# Patient Record
Sex: Female | Born: 1970 | Race: Black or African American | Hispanic: No | Smoking: Never smoker
Health system: Southern US, Community
[De-identification: ages and names within clinical notes are randomized; demographics above are authoritative.]

## PROBLEM LIST (undated history)

## (undated) HISTORY — PX: PARTIAL HYMENECTOMY: SHX327

---

## 2005-10-07 ENCOUNTER — Ambulatory Visit (HOSPITAL_COMMUNITY): Admission: RE | Admit: 2005-10-07 | Discharge: 2005-10-07 | Payer: Self-pay | Admitting: Family Medicine

## 2005-10-07 ENCOUNTER — Encounter: Payer: Self-pay | Admitting: Cardiovascular Disease

## 2005-10-07 ENCOUNTER — Ambulatory Visit: Payer: Self-pay | Admitting: Cardiovascular Disease

## 2005-12-01 ENCOUNTER — Encounter: Admission: RE | Admit: 2005-12-01 | Discharge: 2005-12-01 | Payer: Self-pay | Admitting: Family Medicine

## 2006-07-07 ENCOUNTER — Inpatient Hospital Stay (HOSPITAL_COMMUNITY): Admission: RE | Admit: 2006-07-07 | Discharge: 2006-07-09 | Payer: Self-pay | Admitting: Obstetrics and Gynecology

## 2006-07-07 ENCOUNTER — Encounter (INDEPENDENT_AMBULATORY_CARE_PROVIDER_SITE_OTHER): Payer: Self-pay | Admitting: *Deleted

## 2006-11-26 ENCOUNTER — Encounter: Admission: RE | Admit: 2006-11-26 | Discharge: 2006-11-26 | Payer: Self-pay | Admitting: Family Medicine

## 2007-05-17 ENCOUNTER — Encounter: Admission: RE | Admit: 2007-05-17 | Discharge: 2007-05-17 | Payer: Self-pay | Admitting: Family Medicine

## 2007-09-19 ENCOUNTER — Encounter: Admission: RE | Admit: 2007-09-19 | Discharge: 2007-09-19 | Payer: Self-pay | Admitting: Family Medicine

## 2008-07-25 ENCOUNTER — Encounter: Admission: RE | Admit: 2008-07-25 | Discharge: 2008-07-25 | Payer: Self-pay | Admitting: Obstetrics & Gynecology

## 2008-09-18 ENCOUNTER — Encounter: Admission: RE | Admit: 2008-09-18 | Discharge: 2008-09-18 | Payer: Self-pay | Admitting: Chiropractic Medicine

## 2010-06-25 ENCOUNTER — Encounter: Admission: RE | Admit: 2010-06-25 | Discharge: 2010-06-25 | Payer: Self-pay | Admitting: Obstetrics & Gynecology

## 2010-12-25 ENCOUNTER — Encounter: Payer: Managed Care, Other (non HMO) | Attending: Family Medicine | Admitting: *Deleted

## 2010-12-25 DIAGNOSIS — R7309 Other abnormal glucose: Secondary | ICD-10-CM | POA: Insufficient documentation

## 2010-12-25 DIAGNOSIS — Z713 Dietary counseling and surveillance: Secondary | ICD-10-CM | POA: Insufficient documentation

## 2011-02-06 NOTE — Op Note (Signed)
NAME:  Sophia Flores, Sophia Flores               ACCOUNT NO.:  [id number]   MEDICAL RECORD NO.:  [id number]          PATIENT TYPE:  AMB   LOCATION:  SDC                           FACILITY:  [facility]   PHYSICIAN:  Courtney Paris, M.D.DATE OF BIRTH:  09-23-1970   DATE OF PROCEDURE:  07/07/2006  DATE OF DISCHARGE:                                 OPERATIVE REPORT   PREOPERATIVE DIAGNOSIS:  Bladder injury.   POSTOPERATIVE DIAGNOSIS:  Bladder injury.   OPERATION:  Cystoscopy and posterior bladder repair.   ANESTHESIA:  General.   SURGEON:  Courtney Paris, M.D.   ASSISTANT:  Richardean Sale, M.D.   This 40 year old lady was undergoing a hysterectomy for menometrorrhagia.  She had had two prior C-sections.  In doing the vaginal hysterectomy, a hole  was inadvertently made into the bladder where it was quite attached to the  anterior vaginal tissues.  Urological consultation was then obtained.  By  the time I got to the operating room, the uterus had been removed, the cuff  had been closed, and there was some fluid leaking from the anterior vaginal  wall, consistent with a bladder injury.  The Foley catheter was then  removed, and a cystoscope was inserted, a #21-French, and the bladder lesion  injury could be seen on the posterior wall.  It was separate from the  ureteral orifices, although it was a little closer to the left than to the  right.  She had been previously given some indigo carmine, and I could see  blue efflux from each ureteral orifice.  Then, the hole over the bladder was  then closed with several stitches with 3-0 chromic catgut, but there was  still a little bit of drainage that apparently was from the bladder.  When I  thought it was dry, I put in a Foley catheter, and then with the irrigation  could see another small hole.  I then used some 2-0 chromic to make a second  layer over this with bladder tissue, taking care not to go laterally to  injure the ureteral orifices.   When this was done, it seemed to dry up the  lesion.  I re-passed the cystoscope, and with a 5-French ureteral catheter,  passed this up the left orifice without difficulty or making any  obstruction.  The scope was then removed.  Foley catheter was then  reinserted.  I then helped Dr. Annabell Howells close the anterior vaginal mucosa over  the bladder injury until it was completely closed.  It seemed to be dry at  this point.  The Foley catheter was left indwelling.  I would like to see  the patient in about 10-14 days in the office, and we will do a cystogram  before I pull the Foley catheter.  The rest of the operative note will be  dictated by Dr. Annabell Howells separately.      Courtney Paris, M.D.  Electronically Signed     HMK/MEDQ  D:  07/07/2006  T:  07/08/2006  Job:  [id number]   cc:   Richardean Sale, M.D.  Fax: [phone]

## 2011-02-06 NOTE — Discharge Summary (Signed)
NAME:  Sophia Flores, Sophia Flores               ACCOUNT NO.:  [id number]   MEDICAL RECORD NO.:  [id number]          PATIENT TYPE:  INP   LOCATION:  9302                          FACILITY:  [facility]   PHYSICIAN:  Richardean Sale, M.D.   DATE OF BIRTH:  06-04-1971   DATE OF ADMISSION:  07/07/2006  DATE OF DISCHARGE:  07/09/2006                                 DISCHARGE SUMMARY   ADMISSION DIAGNOSES:  1. Symptomatic uterine fibroids.  2. Menorrhagia.  3. Anemia.  4. Dysmenorrhea.   POSTOPERATIVE DIAGNOSES:  1. Symptomatic uterine fibroids.  2. Menorrhagia.  3. Anemia.  4. Dysmenorrhea.  5. Endometriosis.  6. Paratubal cystotomy with repair.  7. Laparoscopic assisted vaginal hysterectomy with inadvertant cystoscopy      and repair performed on 07/07/2006.   OPERATIVE FINDINGS:  1. Slightly enlarged uterus with multiple subserosal fibroids.  2. Normal appearing adnexa.  3. A 2 cm right paratubal cyst.  4. Endometriosis involving the right ovary.   CONSULTATIONS:  Courtney Paris, M.D. with Urology.   HOSPITAL COURSE AND HISTORY OF PRESENT ILLNESS:  Please see admission  history and physical for details.  Briefly, this is a 40 year old gravida 2,  para 2 African-American female.  She has had progressively worsening menses  with menorrhagia resulting in anemia requiring iron supplementation and  dysmenorrhea.  The patient has been counseled on her options to proceed.  She is status post tubal ligation and has completed childbearing.  After  weighing her options she elected to proceed with a hysterectomy.  On  07/07/2006 she underwent a laparoscopically assisted vaginal hysterectomy at  which time an inadvertent cystotomy occurred.  Dr. Aldean Ast was consulted  intraoperatively performed a cystoscopy with repair of the cystotomy and the  patient tolerated the procedure well.  Postoperatively the patient' course  was unremarkable.   On postoperative day #2 she was ambulating well, was  tolerating a regular  diet and was passing flatus.  A Foley catheter was left in place and to  continue draining the bladder for 10-14 days postoperatively.  The patient's  pain was controlled with oral pain medications and she was subsequently  discharged to home on postop day #2 in good condition.   DISPOSITION:  To home.   CONDITION:  Good.   FOLLOWUP:  The patient will followup with myself in 4 weeks for routine  postoperative visit.  In addition she will followup with Dr. Aldean Ast with  Urology in 10-14 days postop for removal of Foley catheter.   INSTRUCTIONS:  She is to call for any heavy vaginal bleeding.  Any pain not  relieved with pain medication, any vomiting, fever greater than 101, or any  other questions.   MEDICATIONS:  1. Percocet 1-2 tabs p.o. q.4-6 h. p.r.n. pain.  2. Motrin 600 mg p.o. q.6 h. p.r.n. pain.  3. Ferrous Sulfate 325 mg p.o. b.i.d.  4. Colace 100 mg p.o. b.i.d. p.r.n. constipation.  5. Neosporin ointment to be applied to the urethral meatus 2-3 times      daily.   LABORATORY STUDIES:  The patient's postoperative hemoglobin was 8.6, white  count 10.5, platelet count 242, hematocrit 25.0. 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  No endometrial hyperplasia or cervical dysplasia identified.      Richardean Sale, M.D.  Electronically Signed     JW/MEDQ  D:  07/10/2006  T:  07/12/2006  Job:  [id number]

## 2011-02-06 NOTE — Op Note (Signed)
NAME:  Sophia Flores, Sophia Flores               ACCOUNT NO.:  [id number]   MEDICAL RECORD NO.:  [id number]          PATIENT TYPE:  INP   LOCATION:  9302                          FACILITY:  [facility]   PHYSICIAN:  Richardean Sale, M.D.   DATE OF BIRTH:  05-May-1971   DATE OF PROCEDURE:  07/07/2006  DATE OF DISCHARGE:                                 OPERATIVE REPORT   PREOPERATIVE DIAGNOSES:  1. Menorrhagia.  2. Dysmenorrhea.   POSTOPERATIVE DIAGNOSES:  1. Menorrhagia.  2. Dysmenorrhea.  3. Endometriosis.  4. Paratubal cyst.  5. Uterine fibroids.  6. Intraoperative cystotomy.   PROCEDURES:  1. Laparoscopic-assisted vaginal hysterectomy with cystotomy.  2. Cystoscopy and repair of cystotomy.  3. Removal of right paratubal cyst.   SURGEON:  Richardean Sale, M.D.   ASSISTANT:  Cordelia Pen A. Rosalio Macadamia, M.D.   UROLOGY CONSULTATION:  Courtney Paris, M.D.   ANESTHESIA:  General.   COMPLICATIONS:  Cystotomy.   ESTIMATED BLOOD LOSS:  1000 mL.   OPERATIVE FINDINGS:  Approximate 10 weeks' size uterus with normal-appearing  fallopian tubes, a 2 cm right paratubal cyst, two subserosal uterine  fibroids.   SPECIMENS:  Uterus and cervix and paratubal cyst sent to pathology.   INDICATIONS:  This is a 40 year old gravida 2, para 2, African American  female with a history of progressively heavier menses and dysmenorrhea.  The  patient requires the use of the naproxen sodium to control her dysmenorrhea  and has been on iron supplementation due to anemia resulting from menstrual  blood loss.  The patient's cycles have been occurring every 23-29 days, very  heavy on the first 2 days with passage of clots.  The patient's evaluation  has included an ultrasound which showed small uterine fibroids, normal-  appearing adnexa.  The patient underwent endometrial biopsy prior to the  procedure, which was benign.  She has been counseled on her options to  proceed.  She is status post tubal ligation and is not in  need of any  contraception.  Options of control with oral contraceptive pills, a  progesterone-only IUD, endometrial ablation, and hysterectomy have been  reviewed with the patient.  After carefully weighing the risks and benefits  of all procedures, she has decided to proceed with definitive management  with a hysterectomy.  Given that the patient has had two prior cesarean  deliveries, we plan to proceed with a laparoscopic approach, possibly  performing a total laparoscopic hysterectomy or laparoscopic-assisted  vaginal hysterectomy.  The patient is only 40 and desires to retain ovaries.  The adnexa appear normal on ultrasound as well.   Prior to the procedure, the risks, benefits and alternatives of the  procedure were reviewed with the patient in detail.  We discussed the risks,  which include but are not limited to hemorrhage requiring transfusion,  infection, approximately 1% chance of injury to the bowel or the bladder,  the ureters or other intra-abdominal organs which could require surgery  either at the time of this procedure or in the future.  We discussed the  risks of DVT and pulmonary embolus as well as general  surgical risks.  We  also discussed the possibility of having to convert to open abdominal  hysterectomy.  The patient voices understanding of all the above and desires  to proceed.  Informed consent has been obtained before proceeding to the OR.   PROCEDURE:  The patient was taken to the operating room, where she was given  a general anesthetic.  She was then prepped and draped in the usual sterile  fashion and placed in the dorsal lithotomy position.  A Foley catheter was  then placed.  Bimanual exam was performed.  The uterus was approximately 8-  10-week size, mobile in the midline, with no obvious adnexal masses.  A  speculum was then placed in the vagina.  The cervix was grasped with a  single-tooth tenaculum and a uterine sound was then used to sound the   uterus.  Sound length was 10 cm.  There was a moderate amount of descent of  the cervix with placement of the tenaculum.  The RUMI uterine manipulator  was then placed in the standard fashion and the speculum was removed.  The  attention was turned to the patient's abdomen after sterile gown and gloves  were changed.  Approximate 5 mL of 0.25% plain Marcaine were injected into  the infraumbilical area and a 10 mm skin incision was made with a scalpel.  This was carried down sharply to the fascia.  The fascia was then grasped  between two Kocher clamps, elevated, and the fascia was then incised with  the Mayo scissors.  The fascial incision was secured with a pursestring  Vicryl suture and the peritoneum was then identified and entered without  diffiuculty.  The Hasson trocar and sleeve were then introduced and intra-  abdominal placement was confirmed with the laparoscopic.  CO2 gas was then  allowed to insufflate.  Two additional ports were then made in the lower  quadrants, first by injecting 0.25% Marcaine approximately 2 mL on each  side, then making a 5 mm skin incision, and the trocars were introduced  under direct visualization in an area that was free of the inferior  epigastric vessels.  Atraumatic grasper and a blunt probe were then  introduced and the pelvis was then examined with the findings noted above.  There was a moderate amount of scarring from the patient's prior cesarean  deliveries.  The uterus was mobile anterior-posteriorly but with only  moderate mobility laterally.  The round ligaments on both sides were then  grasped with the Gyrus, coagulated and transected with good hemostasis.  The  utero-ovarian ligament on left side and the fallopian tube were then grasped  with the Gyrus, cauterized and transected, with good hemostasis.  The broad  ligament on the left side was then dissected down to level of the uterine artery.  Attention was then turned to the right side.   The utero-ovarian  ligament was then grasped with the Gyrus, was cauterized and transected.  The fallopian tube was grasped with the Gyrus, was cauterized and  transected.  The broad ligament was then incised, and an attempt was made to  skeletonize the uterine arteries but on the right side it was difficult to  isolate the uterine vasculature.  At this point attention was then turned to  the bladder flap.  The bladder was dissected off the lower uterine segment  with sharp dissection.  Attention was then turned back to the right side,  where further skeletonization of the uterine vessels was attempted but  was  unsuccessful.  Given that I did not feel I could secure the uterine  vasculature, I made the decision to convert to a laparoscopic-assisted  vaginal hysterectomy instead of a total laparoscopic hysterectomy or  laparoscopic supracervical hysterectomy.  Prior to proceeding below, the  pedicles were carefully inspected and hemostasis was confirmed and the  Nezhat was used to irrigate the pelvis.  At this point attention was then  turned the patient's vagina.  The RUMI manipulator was removed.  A posterior  weighted speculum was then placed in the vagina and the cervix was grasped  with a single-tooth tenaculum.  Approximately 15 mL of dilute Vasopressin  were then injected circumferentially and a circumferential incision was made  around the cervix with the Bovie.  The posterior cul-de-sac was then entered  sharply without difficulty.  There was no evidence of any adhesive disease  in the posterior cul-de-sac.  Attention was then turned to the anterior  cervix, and sharp dissection was then used to dissect the bladder off of the  cervix in the standard fashion.  There was a moderate amount of adhesive  disease encountered and in the process of making the anterior colporrhaphy  incision in the standard fashion, an inadvertent cystotomy was made.  Given  this finding, I asked for  consultation with Urology and Dr. Aldean Ast was  called.  While waiting for Dr. Aldean Ast to come to the OR, I proceeded to  continue with the hysterectomy, first by securing the uterosacral ligaments  on both sides.  These were clamped, transected and suture ligated with  Vicryl suture.  The uterine vessels on both sides were then clamped,  transected and suture ligated as well.  The Gyrus was then also used to  cauterize any additional attachments, and these were transected.  The uterus  was then bisected in the midline in order to facilitate removal.  Once both  halves of the uterus and cervix were removed, this was sent to pathology.  The vaginal cuff and the bladder were then  very carefully inspected.  There  was a moderate of bleeding coming from the edges of the vaginal cuff.  The  posterior cuff was then run with a locked Vicryl suture.  There was small  amount of bleeding coming from beneath the anterior portion of the vaginal cuff.  This was cauterized with the Bovie, taking care to assure that no  additional trauma was made to the bladder.  At this point the posterior  aspect of the vaginal cuff was closed using interrupted Vicryl sutures up to  the level of the uterosacral ligaments.  At this point Dr. Aldean Ast entered  the room and inspected the area.  Cystoscopy was performed by Dr. Aldean Ast.  Indigo carmine was introduced in the patient's IV fluids, and indigo carmine  was noted to be effluxing from both ureters.  The cystoscopy and cystotomy  repair will be dictated by Dr. Aldean Ast.   Once the cystotomy had been adequately repaired by Dr. Aldean Ast, I then  proceeded to close the remaining portion of the vaginal cuff with vicryl  suture.  The vaginal cuff was hemostatic.  The Foley catheter had already  been replaced.  The vagina was then packed with 1-inch plain gauze soaked in  Estrace cream for additional hemostasis.  At this point attention was then  turned back to  the patient's abdomen after sterile gown and gloves were  changed.  CO2 gas was then allowed to reinsufflate and the camera  was then  introduced.  The pelvis was then very carefully inspected and the Nezhat was  then used to irrigate.  Any areas of bleeding along the peritoneal surfaces  were cauterized with the bipolar cautery.  The vaginal cuff was very  carefully inspected, and there appeared to be no active bleeding.  A 2 x 2  piece of Surgicel was introduced into the pelvis through one of the 5 mm  ports, and this was placed over the vaginal cuff for additional hemostasis  and for prevention of adhesion formation.  The utero-ovarian pedicles were  then carefully inspected and were hemostatic.  The right fallopian tube  contained within it an approximately 2 cm paratubal cyst.  This was removed  using the electrocautery and the laparoscopic scissors and was sent to  pathology labeled as right paratubal cyst.  Once all pedicles in the  pelvis were confirmed as hemostatic, attention was then turned to the upper  abdomen, where the liver and gallbladder appeared normal.  At this point the  procedure was terminated.  All instruments were removed from the patient's  abdomen.  The trocars were removed under direct visualization and CO2 gas  was then allowed to escape.  The pursestring fascial suture in the umbilical  incision was then tied with adequate closure of the fascia.  The umbilical  skin incision was closed with 4-0 Vicryl in a subcuticular fashion and the 5  mm skin incision was then closed with Dermabond.   The patient tolerated the procedure very well.  All sponge, lap, needle and  instrument counts were correct x2.  The patient was taken to the recovery  room awake and in stable condition      Richardean Sale, M.D.  Electronically Signed     JW/MEDQ  D:  07/07/2006  T:  07/09/2006  Job:  [id number]

## 2011-02-24 ENCOUNTER — Ambulatory Visit: Payer: Managed Care, Other (non HMO) | Admitting: *Deleted

## 2011-05-04 ENCOUNTER — Ambulatory Visit
Admission: RE | Admit: 2011-05-04 | Discharge: 2011-05-04 | Disposition: A | Discharge status: Home / Self Care | Payer: Managed Care, Other (non HMO) | Source: Ambulatory Visit | Attending: Rheumatology | Admitting: Rheumatology

## 2011-05-04 ENCOUNTER — Other Ambulatory Visit: Payer: Self-pay | Admitting: Rheumatology

## 2011-05-04 DIAGNOSIS — M7918 Myalgia, other site: Secondary | ICD-10-CM

## 2011-05-04 DIAGNOSIS — R1031 Right lower quadrant pain: Secondary | ICD-10-CM

## 2011-07-16 ENCOUNTER — Other Ambulatory Visit: Payer: Self-pay

## 2011-07-16 ENCOUNTER — Other Ambulatory Visit (HOSPITAL_COMMUNITY)
Admission: RE | Admit: 2011-07-16 | Discharge: 2011-07-16 | Disposition: A | Discharge status: Home / Self Care | Payer: Managed Care, Other (non HMO) | Source: Ambulatory Visit | Attending: Family Medicine | Admitting: Family Medicine

## 2011-07-16 DIAGNOSIS — Z01419 Encounter for gynecological examination (general) (routine) without abnormal findings: Secondary | ICD-10-CM | POA: Insufficient documentation

## 2011-08-05 ENCOUNTER — Other Ambulatory Visit (HOSPITAL_COMMUNITY): Payer: Self-pay | Admitting: Family Medicine

## 2011-08-05 DIAGNOSIS — Z1231 Encounter for screening mammogram for malignant neoplasm of breast: Secondary | ICD-10-CM

## 2011-09-04 ENCOUNTER — Ambulatory Visit (HOSPITAL_COMMUNITY): Payer: Managed Care, Other (non HMO)

## 2011-09-08 ENCOUNTER — Ambulatory Visit (HOSPITAL_COMMUNITY)
Admission: RE | Admit: 2011-09-08 | Discharge: 2011-09-08 | Disposition: A | Discharge status: Home / Self Care | Payer: Self-pay | Source: Ambulatory Visit | Attending: Family Medicine | Admitting: Family Medicine

## 2011-09-08 DIAGNOSIS — Z1231 Encounter for screening mammogram for malignant neoplasm of breast: Secondary | ICD-10-CM | POA: Insufficient documentation

## 2012-08-17 ENCOUNTER — Other Ambulatory Visit (HOSPITAL_COMMUNITY): Payer: Self-pay | Admitting: Obstetrics & Gynecology

## 2012-08-17 DIAGNOSIS — Z1231 Encounter for screening mammogram for malignant neoplasm of breast: Secondary | ICD-10-CM

## 2012-09-19 ENCOUNTER — Ambulatory Visit (HOSPITAL_COMMUNITY)
Admission: RE | Admit: 2012-09-19 | Discharge: 2012-09-19 | Disposition: A | Discharge status: Home / Self Care | Payer: BC Managed Care – PPO | Source: Ambulatory Visit | Attending: Obstetrics & Gynecology | Admitting: Obstetrics & Gynecology

## 2012-09-19 DIAGNOSIS — Z1231 Encounter for screening mammogram for malignant neoplasm of breast: Secondary | ICD-10-CM

## 2012-09-27 ENCOUNTER — Other Ambulatory Visit: Payer: Self-pay | Admitting: Obstetrics & Gynecology

## 2012-09-27 DIAGNOSIS — R928 Other abnormal and inconclusive findings on diagnostic imaging of breast: Secondary | ICD-10-CM

## 2012-10-06 ENCOUNTER — Ambulatory Visit
Admission: RE | Admit: 2012-10-06 | Discharge: 2012-10-06 | Disposition: A | Discharge status: Home / Self Care | Payer: BC Managed Care – PPO | Source: Ambulatory Visit | Attending: Obstetrics & Gynecology | Admitting: Obstetrics & Gynecology

## 2012-10-06 DIAGNOSIS — R928 Other abnormal and inconclusive findings on diagnostic imaging of breast: Secondary | ICD-10-CM

## 2013-02-03 ENCOUNTER — Other Ambulatory Visit: Payer: Self-pay | Admitting: Rheumatology

## 2013-02-03 ENCOUNTER — Ambulatory Visit
Admission: RE | Admit: 2013-02-03 | Discharge: 2013-02-03 | Disposition: A | Discharge status: Home / Self Care | Payer: BC Managed Care – PPO | Source: Ambulatory Visit | Attending: Rheumatology | Admitting: Rheumatology

## 2013-02-03 DIAGNOSIS — M545 Low back pain, unspecified: Secondary | ICD-10-CM

## 2013-08-16 ENCOUNTER — Other Ambulatory Visit: Payer: Self-pay

## 2013-08-16 DIAGNOSIS — Z1231 Encounter for screening mammogram for malignant neoplasm of breast: Secondary | ICD-10-CM

## 2013-10-09 ENCOUNTER — Ambulatory Visit
Admission: RE | Admit: 2013-10-09 | Discharge: 2013-10-09 | Disposition: A | Discharge status: Home / Self Care | Payer: BC Managed Care – PPO | Source: Ambulatory Visit

## 2013-10-09 DIAGNOSIS — Z1231 Encounter for screening mammogram for malignant neoplasm of breast: Secondary | ICD-10-CM

## 2016-10-06 ENCOUNTER — Other Ambulatory Visit: Payer: Self-pay | Admitting: Family

## 2016-10-06 DIAGNOSIS — E049 Nontoxic goiter, unspecified: Secondary | ICD-10-CM

## 2016-10-30 ENCOUNTER — Ambulatory Visit
Admission: RE | Admit: 2016-10-30 | Discharge: 2016-10-30 | Disposition: A | Discharge status: Home / Self Care | Payer: BC Managed Care – PPO | Source: Ambulatory Visit | Attending: Family | Admitting: Family

## 2016-10-30 DIAGNOSIS — E049 Nontoxic goiter, unspecified: Secondary | ICD-10-CM

## 2018-10-07 ENCOUNTER — Other Ambulatory Visit (HOSPITAL_COMMUNITY)
Admission: RE | Admit: 2018-10-07 | Discharge: 2018-10-07 | Disposition: A | Discharge status: Home / Self Care | Payer: BC Managed Care – PPO | Source: Ambulatory Visit | Attending: Nurse Practitioner | Admitting: Nurse Practitioner

## 2018-10-07 ENCOUNTER — Other Ambulatory Visit: Payer: Self-pay | Admitting: Nurse Practitioner

## 2018-10-07 DIAGNOSIS — Z1231 Encounter for screening mammogram for malignant neoplasm of breast: Secondary | ICD-10-CM

## 2018-10-07 DIAGNOSIS — Z01411 Encounter for gynecological examination (general) (routine) with abnormal findings: Secondary | ICD-10-CM | POA: Insufficient documentation

## 2018-10-10 ENCOUNTER — Other Ambulatory Visit: Payer: Self-pay | Admitting: Nurse Practitioner

## 2018-10-14 LAB — CYTOLOGY - PAP
Diagnosis: NEGATIVE
HPV: NOT DETECTED

## 2018-11-25 ENCOUNTER — Ambulatory Visit
Admission: RE | Admit: 2018-11-25 | Discharge: 2018-11-25 | Disposition: A | Discharge status: Home / Self Care | Payer: BC Managed Care – PPO | Source: Ambulatory Visit | Attending: Nurse Practitioner | Admitting: Nurse Practitioner

## 2018-11-25 DIAGNOSIS — Z1231 Encounter for screening mammogram for malignant neoplasm of breast: Secondary | ICD-10-CM

## 2019-04-04 IMAGING — MG DIGITAL SCREENING BILATERAL MAMMOGRAM WITH TOMO AND CAD
6 of 10 series · 6 of 30 positions shown · non-contrast
Comparison: Previous exam(s).

CLINICAL DATA: Screening.

EXAM:
DIGITAL SCREENING BILATERAL MAMMOGRAM WITH TOMO AND CAD

[R CC synth-2D]
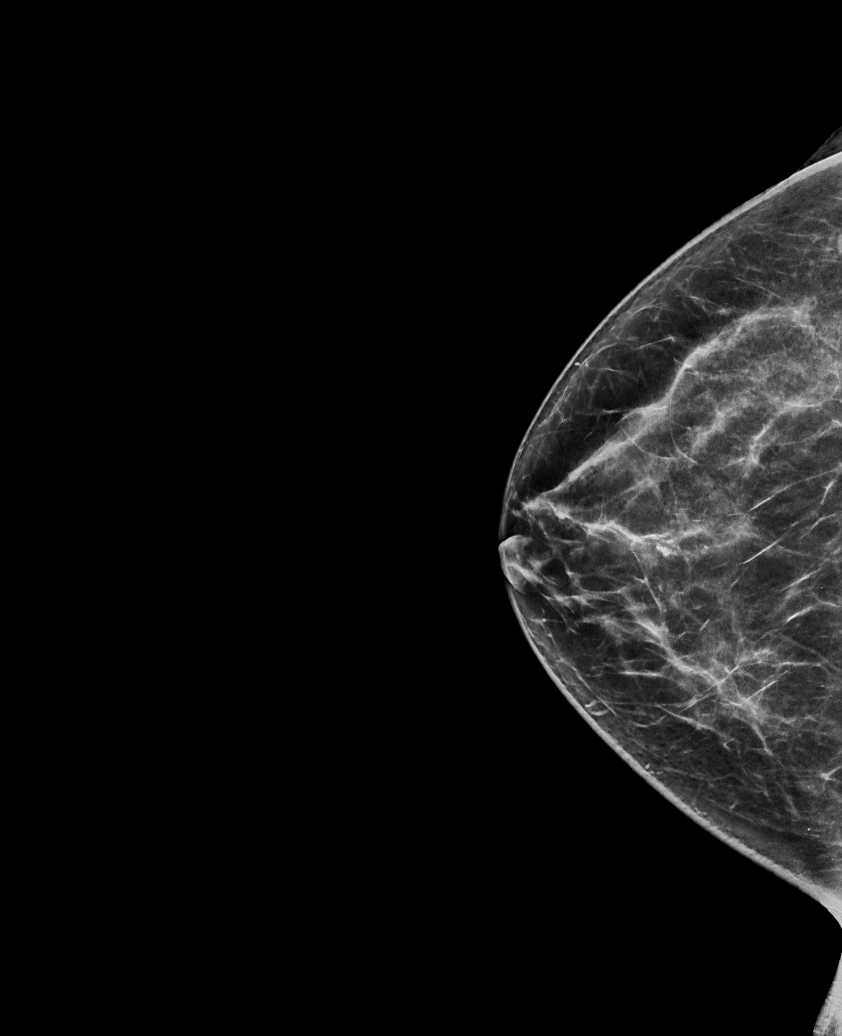

[R MLO synth-2D (1 of 2)]
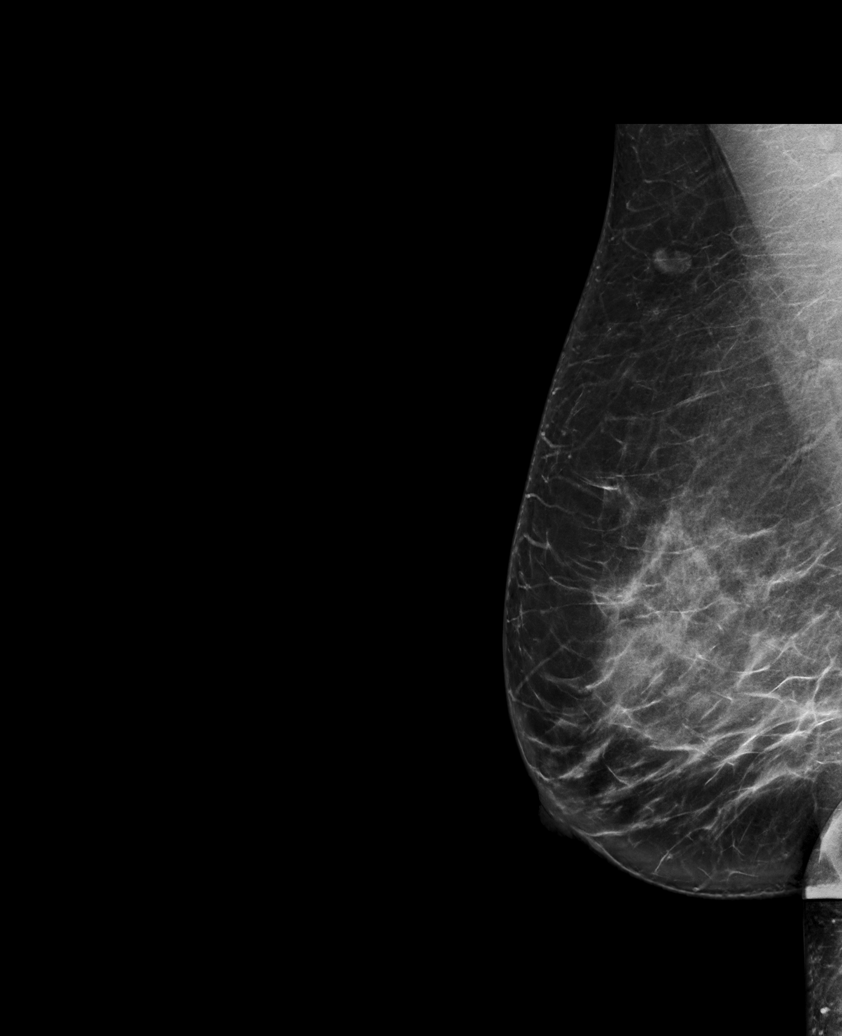

[R MLO synth-2D (2 of 2)]
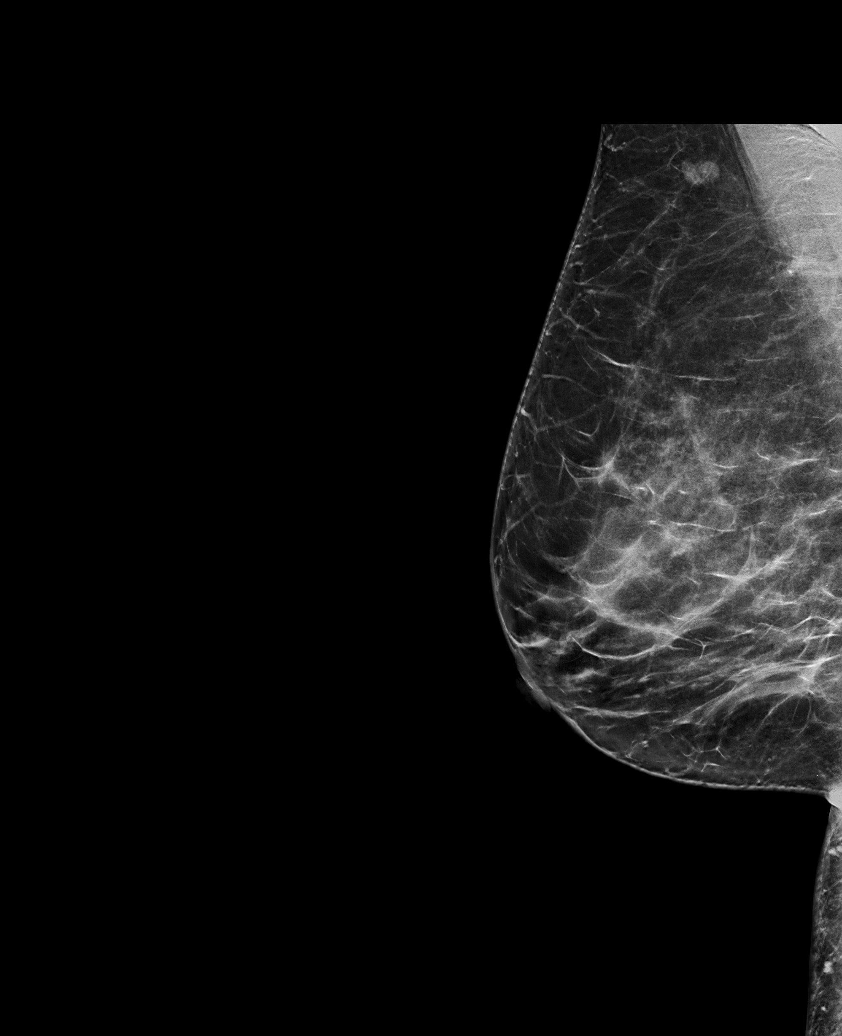

[L CC synth-2D]
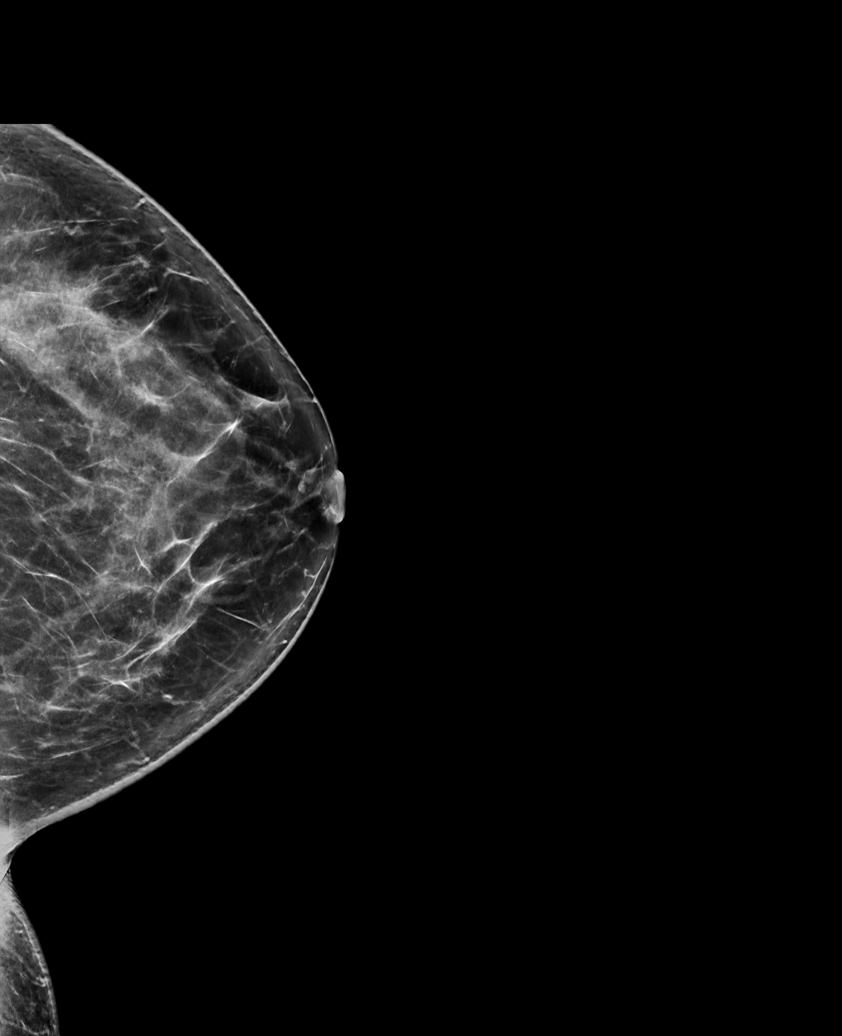

[L MLO synth-2D]
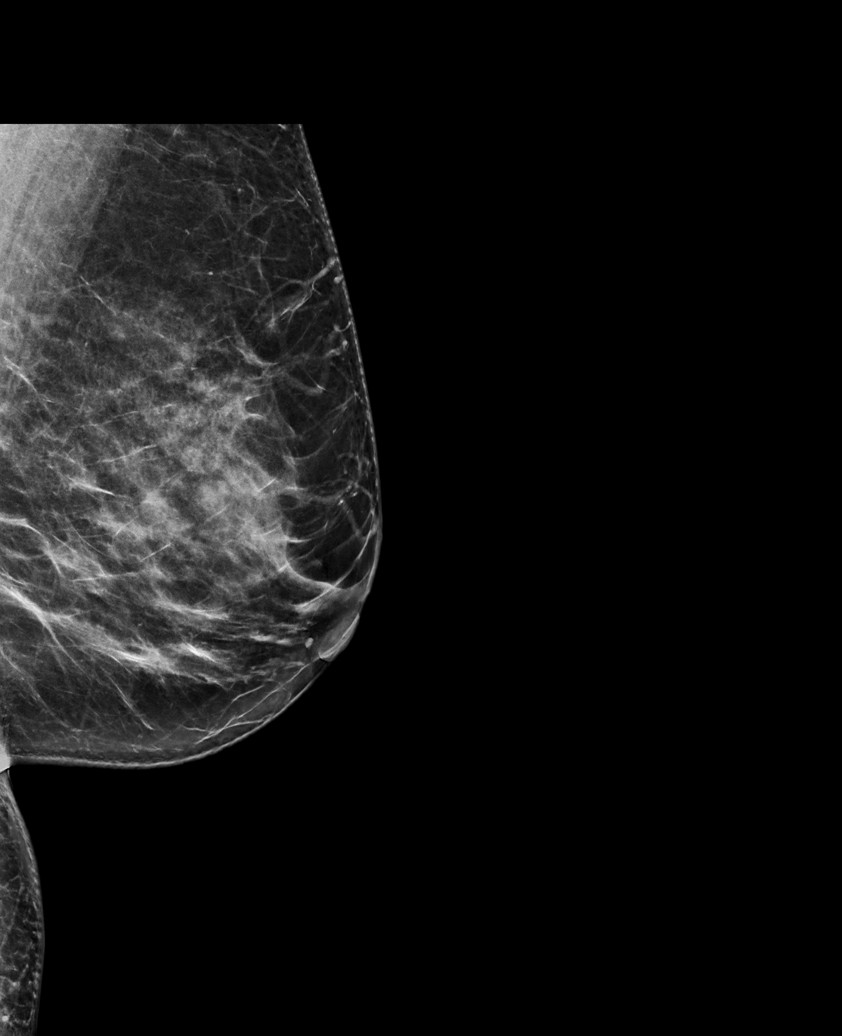

[L CC tomo · tomo slice 45/88.0]
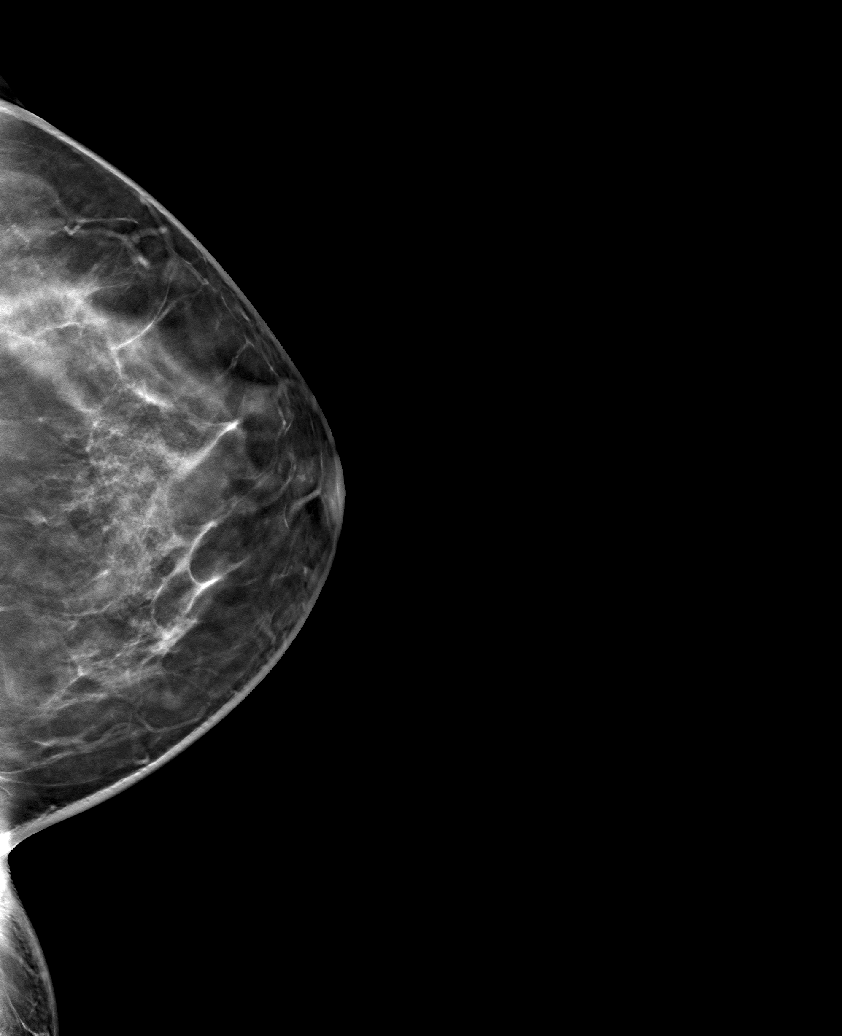

[6 of 30 positions shown; findings below may reference images not displayed]

ACR Breast Density Category c: The breast tissue is heterogeneously
dense, which may obscure small masses.
FINDINGS: There are no findings suspicious for malignancy. Images were
processed with CAD.
IMPRESSION: No mammographic evidence of malignancy. A result letter of this
screening mammogram will be mailed directly to the patient.

RECOMMENDATION:
Screening mammogram in one year. (Code:FT-U-LHB)

BI-RADS CATEGORY  1: Negative.

## 2019-07-24 ENCOUNTER — Ambulatory Visit: Payer: BC Managed Care – PPO

## 2019-11-13 ENCOUNTER — Other Ambulatory Visit: Payer: Self-pay | Admitting: Family Medicine

## 2019-11-13 DIAGNOSIS — Z1231 Encounter for screening mammogram for malignant neoplasm of breast: Secondary | ICD-10-CM

## 2019-11-15 ENCOUNTER — Other Ambulatory Visit: Payer: Self-pay | Admitting: Family Medicine

## 2019-11-15 DIAGNOSIS — M7989 Other specified soft tissue disorders: Secondary | ICD-10-CM

## 2019-11-27 ENCOUNTER — Ambulatory Visit
Admission: RE | Admit: 2019-11-27 | Discharge: 2019-11-27 | Disposition: A | Discharge status: Home / Self Care | Payer: BC Managed Care – PPO | Source: Ambulatory Visit | Attending: Family Medicine | Admitting: Family Medicine

## 2019-11-27 DIAGNOSIS — M7989 Other specified soft tissue disorders: Secondary | ICD-10-CM

## 2019-12-18 ENCOUNTER — Ambulatory Visit
Admission: RE | Admit: 2019-12-18 | Discharge: 2019-12-18 | Disposition: A | Discharge status: Home / Self Care | Payer: BC Managed Care – PPO | Source: Ambulatory Visit | Attending: Family Medicine | Admitting: Family Medicine

## 2019-12-18 ENCOUNTER — Other Ambulatory Visit: Payer: Self-pay

## 2019-12-18 DIAGNOSIS — Z1231 Encounter for screening mammogram for malignant neoplasm of breast: Secondary | ICD-10-CM

## 2020-11-25 ENCOUNTER — Other Ambulatory Visit: Payer: Self-pay | Admitting: Family Medicine

## 2020-11-25 DIAGNOSIS — Z1231 Encounter for screening mammogram for malignant neoplasm of breast: Secondary | ICD-10-CM

## 2021-01-16 ENCOUNTER — Ambulatory Visit
Admission: RE | Admit: 2021-01-16 | Discharge: 2021-01-16 | Disposition: A | Discharge status: Home / Self Care | Payer: Managed Care, Other (non HMO) | Source: Ambulatory Visit | Attending: Family Medicine | Admitting: Family Medicine

## 2021-01-16 ENCOUNTER — Other Ambulatory Visit: Payer: Self-pay

## 2021-01-16 DIAGNOSIS — Z1231 Encounter for screening mammogram for malignant neoplasm of breast: Secondary | ICD-10-CM

## 2021-02-03 DIAGNOSIS — E782 Mixed hyperlipidemia: Secondary | ICD-10-CM | POA: Diagnosis not present

## 2021-02-03 DIAGNOSIS — I10 Essential (primary) hypertension: Secondary | ICD-10-CM | POA: Diagnosis not present

## 2021-02-03 DIAGNOSIS — F419 Anxiety disorder, unspecified: Secondary | ICD-10-CM | POA: Diagnosis not present

## 2021-02-27 DIAGNOSIS — B029 Zoster without complications: Secondary | ICD-10-CM | POA: Diagnosis not present

## 2021-07-01 DIAGNOSIS — Z03818 Encounter for observation for suspected exposure to other biological agents ruled out: Secondary | ICD-10-CM | POA: Diagnosis not present

## 2021-07-01 DIAGNOSIS — R059 Cough, unspecified: Secondary | ICD-10-CM | POA: Diagnosis not present

## 2021-07-01 DIAGNOSIS — R509 Fever, unspecified: Secondary | ICD-10-CM | POA: Diagnosis not present

## 2021-07-01 DIAGNOSIS — B349 Viral infection, unspecified: Secondary | ICD-10-CM | POA: Diagnosis not present

## 2021-07-20 DIAGNOSIS — U071 COVID-19: Secondary | ICD-10-CM | POA: Diagnosis not present

## 2021-07-20 DIAGNOSIS — R52 Pain, unspecified: Secondary | ICD-10-CM | POA: Diagnosis not present

## 2021-07-20 DIAGNOSIS — R6883 Chills (without fever): Secondary | ICD-10-CM | POA: Diagnosis not present

## 2021-07-20 DIAGNOSIS — J069 Acute upper respiratory infection, unspecified: Secondary | ICD-10-CM | POA: Diagnosis not present

## 2021-08-08 ENCOUNTER — Other Ambulatory Visit: Payer: Self-pay

## 2021-08-08 ENCOUNTER — Ambulatory Visit
Admission: EM | Admit: 2021-08-08 | Discharge: 2021-08-08 | Disposition: A | Discharge status: Home / Self Care | Payer: BC Managed Care – PPO

## 2021-08-08 DIAGNOSIS — G5682 Other specified mononeuropathies of left upper limb: Secondary | ICD-10-CM | POA: Diagnosis not present

## 2021-08-08 MED ORDER — BACLOFEN 10 MG PO TABS: 10.0000 mg | ORAL_TABLET | Freq: Every day | ORAL | 0 refills | Status: AC

## 2021-08-08 MED ORDER — METHYLPREDNISOLONE 4 MG PO TABS: ORAL_TABLET | ORAL | 0 refills | Status: AC

## 2021-08-08 MED ORDER — KETOROLAC TROMETHAMINE 60 MG/2ML IM SOLN
60.0000 mg | Freq: Once | INTRAMUSCULAR | Status: AC
  Administered 2021-08-08: 60 mg via INTRAMUSCULAR

## 2021-08-08 NOTE — ED Provider Notes (Signed)
[facility]    CSN: [id number] Arrival date & time: 08/08/21  0810   History   Chief Complaint No chief complaint on file.  HPI Sophia Flores is a 49 y.o. female. Patient states that earlier this week she was lifting some empty boxes out of her friend's car and putting them into her own, states she never felt a pop or a pull while she was doing this but later that evening noticed that her left shoulder began to hurt.  Patient states over the next 2 days she has noticed increased pain that is now radiating down her arm and she noticed this morning that her left hand and fingers are swollen.  Patient states she feels that when she touches the muscles in her arms they feel like they are on fire.  Patient reports pain with flexing, extending and laterally lifting her left arm.  Patient denies rash on skin.  Patient denies previous injury to left shoulder, arm, elbow or hand.  Patient states she is unaware whether or not she has any arthritis in her cervical spine.  Patient states she is never had an injury like this before.   The history is provided by the patient.   History reviewed. No pertinent past medical history. There are no problems to display for this patient.  Past Surgical History:  Procedure Laterality Date   PARTIAL HYMENECTOMY     OB History   No obstetric history on file.    Home Medications    Prior to Admission medications   Medication Sig Start Date End Date Taking? Authorizing Provider  atorvastatin (LIPITOR) 10 MG tablet Take 1 tablet by mouth daily. 02/17/20  Yes [provider]  baclofen (LIORESAL) 10 MG tablet Take 1 tablet (10 mg total) by mouth at bedtime for 7 days. 08/08/21 08/15/21 Yes Theadora Rama Scales, PA-C  escitalopram (LEXAPRO) 10 MG tablet 1 tablet 02/29/20  Yes [provider]  ibuprofen (ADVIL) 200 MG tablet Take 200 mg by mouth every 6 (six) hours as needed.   Yes [provider]  methylPREDNISolone  (MEDROL) 4 MG tablet Take 8 tablets (32 mg total) by mouth daily for 1 day, THEN 7 tablets (28 mg total) daily for 1 day, THEN 6 tablets (24 mg total) daily for 1 day, THEN 5 tablets (20 mg total) daily for 1 day, THEN 4 tablets (16 mg total) daily for 1 day, THEN 3 tablets (12 mg total) daily for 1 day, THEN 2 tablets (8 mg total) daily for 1 day, THEN 1 tablet (4 mg total) daily for 1 day. 08/08/21 08/16/21 Yes Theadora Rama Scales, PA-C  amLODipine (NORVASC) 5 MG tablet Take 5 mg by mouth daily. 07/04/21   [provider]   Family History Family History  Problem Relation Age of Onset   Breast cancer Mother 78   Social History Social History   Tobacco Use   Smoking status: Never   Smokeless tobacco: Never  Substance Use Topics   Alcohol use: Never   Drug use: Never   Allergies   Codeine, Metformin hcl, and Morphine  Review of Systems Review of Systems Pertinent findings noted in history of present illness.   Physical Exam Triage Vital Signs ED Triage Vitals  Enc Vitals Group     BP 07/18/21 0827 (!) 147/82     Pulse Rate 07/18/21 0827 72     Resp 07/18/21 0827 18     Temp 07/18/21 0827 98.3 F (36.8 C)  Temp Source 07/18/21 0827 Oral     SpO2 07/18/21 0827 98 %     Weight --      Height --      Head Circumference --      Peak Flow --      Pain Score 07/18/21 0826 5     Pain Loc --      Pain Edu? --      Excl. in GC? --    No data found.  Updated Vital Signs BP (!) 156/92 (BP Location: Right Arm)   Pulse 75   Temp 98.2 F (36.8 C) (Oral)   Resp 20   SpO2 98%   Visual Acuity Right Eye Distance:   Left Eye Distance:   Bilateral Distance:    Right Eye Near:   Left Eye Near:    Bilateral Near:     Physical Exam Vitals and nursing note reviewed.  Constitutional:      General: She is not in acute distress.    Appearance: Normal appearance. She is not ill-appearing.  HENT:     Head: Normocephalic and atraumatic.  Eyes:     General: Lids  are normal.        Right eye: No discharge.        Left eye: No discharge.     Extraocular Movements: Extraocular movements intact.     Conjunctiva/sclera: Conjunctivae normal.     Right eye: Right conjunctiva is not injected.     Left eye: Left conjunctiva is not injected.  Neck:     Trachea: Trachea and phonation normal.  Cardiovascular:     Rate and Rhythm: Normal rate and regular rhythm.     Pulses: Normal pulses.     Heart sounds: Normal heart sounds. No murmur heard.   No friction rub. No gallop.  Pulmonary:     Effort: Pulmonary effort is normal. No accessory muscle usage, prolonged expiration or respiratory distress.     Breath sounds: Normal breath sounds. No stridor, decreased air movement or transmitted upper airway sounds. No decreased breath sounds, wheezing, rhonchi or rales.  Chest:     Chest wall: No tenderness.  Musculoskeletal:        General: Normal range of motion.     Cervical back: Normal range of motion and neck supple. Normal range of motion.     Comments: Diffuse tenderness of the left cervical paraspinous muscles, left upper trapezius and left posterior deltoid muscles.  There is appreciable swelling of soft tissue on the back of her left hand and fingers.  Lymphadenopathy:     Cervical: No cervical adenopathy.  Skin:    General: Skin is warm and dry.     Findings: No erythema or rash.  Neurological:     General: No focal deficit present.     Mental Status: She is alert and oriented to person, place, and time.  Psychiatric:        Mood and Affect: Mood normal.        Behavior: Behavior normal.   UC Treatments / Results  Labs (all labs ordered are listed, but only abnormal results are displayed)  Labs Reviewed - No data to display  EKG  Radiology No results found.  Procedures Procedures (including critical care time)  Medications Ordered in UC Medications  ketorolac (TORADOL) injection 60 mg (60 mg Intramuscular Given 08/08/21 1000)     Initial Impression / Assessment and Plan / UC Course  I have reviewed the triage vital signs  and the nursing notes.  Pertinent labs & imaging results that were available during my care of the patient were reviewed by me and considered in my medical decision making (see chart for details).      Patient demonstrates ability to achieve full range of motion but did so with difficulty secondary to pain.  I provided her with an injection of ketorolac today to significantly reduce her pain for the next several hours, a prolonged course of steroids that I advised her to pick up and take the first dose today, and a prescription for a muscle relaxer which should help her sleep better at night.  Patient advised to return early next week if she would like to have a repeat ketorolac injection, she could also go to one of our weekend urgent cares to have this done as well.  Final Clinical Impressions(s) / UC Diagnoses   Final diagnoses:  Pinched nerve in shoulder, left     Discharge Instructions      You received an injection of ketorolac in the office today which should significantly reduce your pain for the next 6 to 8 hours.    I provided you with 2 prescriptions, 1 for methylprednisolone which is a steroid and baclofen which is a muscle relaxer.    Please pick up your medications and take your first dose of methylprednisolone as soon as you leave the clinic today.  Please take your first dose of baclofen an hour or 2 before you plan to go to sleep.  If you find that you have not achieved significant relief of your discomfort on Monday, please return here for a repeat ketorolac injection.  I have provided you with a note to be out of work until Monday.  Is my recommendation that you stay home, alternately apply a gallon sized Ziploc bag full of ice and water and a heating pad to your left shoulder and neck region, 20 minutes each time, to help reduce insulation and improve circulation to the  area.  At your request, I have provided you with a sling for your left arm.  Please wear it is much as you think you need but keep in mind that it is important to move your arm is much as tolerated once your pain is improving.  This will help work out the extra lactic acid buildup that is making your hands swell.  As you have already guessed, I also recommend that you drink plenty of clear fluids to help clean the lactic acid out of your body as well.     ED Prescriptions     Medication Sig Dispense Auth. Provider   baclofen (LIORESAL) 10 MG tablet Take 1 tablet (10 mg total) by mouth at bedtime for 7 days. 7 tablet Theadora Rama Scales, PA-C   methylPREDNISolone (MEDROL) 4 MG tablet Take 8 tablets (32 mg total) by mouth daily for 1 day, THEN 7 tablets (28 mg total) daily for 1 day, THEN 6 tablets (24 mg total) daily for 1 day, THEN 5 tablets (20 mg total) daily for 1 day, THEN 4 tablets (16 mg total) daily for 1 day, THEN 3 tablets (12 mg total) daily for 1 day, THEN 2 tablets (8 mg total) daily for 1 day, THEN 1 tablet (4 mg total) daily for 1 day. 36 tablet Theadora Rama Scales, PA-C      PDMP not reviewed this encounter.  Disposition Upon Discharge:  Patient presented with an acute illness with associated systemic symptoms and significant  discomfort requiring urgent management. In my opinion, this is a condition that a prudent lay person (someone who possesses an average knowledge of health and medicine) may potentially expect to result in complications if not addressed urgently such as respiratory distress, impairment of bodily function or dysfunction of bodily organs.   Routine symptom specific, illness specific and/or disease specific instructions were discussed with the patient and/or caregiver at length.   As such, the patient has been evaluated and assessed, work-up was performed and treatment was provided in alignment with urgent care protocols and evidence based medicine.   Patient/parent/caregiver has been advised that the patient may require follow up for further testing and treatment if the symptoms continue in spite of treatment, as clinically indicated and appropriate.  Patient/parent/caregiver has been advised to return to the [facility] or PCP in 3-5 days if no better; to PCP or the Emergency Department if new signs and symptoms develop, or if the current signs or symptoms continue to change or worsen for further workup, evaluation and treatment as clinically indicated and appropriate  The patient will follow up with their current PCP if and as advised. If the patient does not currently have a PCP we will assist them in obtaining one.   Patient/parent/caregiver verbalized understanding and agreement of plan as discussed.  All questions were addressed during visit.  Please see discharge instructions below for further details of plan.  Condition: stable for discharge home Home: take medications as prescribed; routine discharge instructions as discussed; follow up as advised.    Theadora Rama Scales, PA-C 08/08/21 1328

## 2021-08-08 NOTE — Discharge Instructions (Addendum)
You received an injection of ketorolac in the office today which should significantly reduce your pain for the next 6 to 8 hours.    I provided you with 2 prescriptions, 1 for methylprednisolone which is a steroid and baclofen which is a muscle relaxer.    Please pick up your medications and take your first dose of methylprednisolone as soon as you leave the clinic today.  Please take your first dose of baclofen an hour or 2 before you plan to go to sleep.  If you find that you have not achieved significant relief of your discomfort on Monday, please return here for a repeat ketorolac injection.  I have provided you with a note to be out of work until Monday.  Is my recommendation that you stay home, alternately apply a gallon sized Ziploc bag full of ice and water and a heating pad to your left shoulder and neck region, 20 minutes each time, to help reduce insulation and improve circulation to the area.  At your request, I have provided you with a sling for your left arm.  Please wear it is much as you think you need but keep in mind that it is important to move your arm is much as tolerated once your pain is improving.  This will help work out the extra lactic acid buildup that is making your hands swell.  As you have already guessed, I also recommend that you drink plenty of clear fluids to help clean the lactic acid out of your body as well.

## 2021-08-08 NOTE — ED Triage Notes (Signed)
Pt states she might have injured her arm, she has pain in her left shoulder down to her  fingers (hand is swollen). Pt states she was lifting boxes this week that were not heavy.  Started: Tuesday

## 2021-09-11 DIAGNOSIS — E782 Mixed hyperlipidemia: Secondary | ICD-10-CM | POA: Diagnosis not present

## 2021-09-11 DIAGNOSIS — R350 Frequency of micturition: Secondary | ICD-10-CM | POA: Diagnosis not present

## 2021-09-11 DIAGNOSIS — I10 Essential (primary) hypertension: Secondary | ICD-10-CM | POA: Diagnosis not present

## 2021-09-11 DIAGNOSIS — R7309 Other abnormal glucose: Secondary | ICD-10-CM | POA: Diagnosis not present

## 2021-09-23 DIAGNOSIS — R7309 Other abnormal glucose: Secondary | ICD-10-CM | POA: Diagnosis not present

## 2021-11-09 DIAGNOSIS — J069 Acute upper respiratory infection, unspecified: Secondary | ICD-10-CM | POA: Diagnosis not present

## 2021-11-09 DIAGNOSIS — J3089 Other allergic rhinitis: Secondary | ICD-10-CM | POA: Diagnosis not present

## 2021-11-09 DIAGNOSIS — Z03818 Encounter for observation for suspected exposure to other biological agents ruled out: Secondary | ICD-10-CM | POA: Diagnosis not present

## 2021-12-25 DIAGNOSIS — E782 Mixed hyperlipidemia: Secondary | ICD-10-CM | POA: Diagnosis not present

## 2021-12-25 DIAGNOSIS — E1169 Type 2 diabetes mellitus with other specified complication: Secondary | ICD-10-CM | POA: Diagnosis not present

## 2021-12-25 DIAGNOSIS — I10 Essential (primary) hypertension: Secondary | ICD-10-CM | POA: Diagnosis not present

## 2022-02-05 DIAGNOSIS — Z0000 Encounter for general adult medical examination without abnormal findings: Secondary | ICD-10-CM | POA: Diagnosis not present

## 2022-02-16 DIAGNOSIS — T7840XA Allergy, unspecified, initial encounter: Secondary | ICD-10-CM | POA: Diagnosis not present

## 2022-02-16 DIAGNOSIS — J069 Acute upper respiratory infection, unspecified: Secondary | ICD-10-CM | POA: Diagnosis not present

## 2022-02-25 ENCOUNTER — Other Ambulatory Visit: Payer: Self-pay | Admitting: Family Medicine

## 2022-02-25 DIAGNOSIS — Z1231 Encounter for screening mammogram for malignant neoplasm of breast: Secondary | ICD-10-CM

## 2022-04-09 ENCOUNTER — Ambulatory Visit
Admission: RE | Admit: 2022-04-09 | Discharge: 2022-04-09 | Disposition: A | Discharge status: Home / Self Care | Payer: BC Managed Care – PPO | Source: Ambulatory Visit | Attending: Family Medicine | Admitting: Family Medicine

## 2022-04-09 DIAGNOSIS — Z1231 Encounter for screening mammogram for malignant neoplasm of breast: Secondary | ICD-10-CM | POA: Diagnosis not present

## 2022-06-12 DIAGNOSIS — B3731 Acute candidiasis of vulva and vagina: Secondary | ICD-10-CM | POA: Diagnosis not present

## 2022-06-12 DIAGNOSIS — N898 Other specified noninflammatory disorders of vagina: Secondary | ICD-10-CM | POA: Diagnosis not present

## 2022-08-17 DIAGNOSIS — R051 Acute cough: Secondary | ICD-10-CM | POA: Diagnosis not present

## 2022-12-23 DIAGNOSIS — E1169 Type 2 diabetes mellitus with other specified complication: Secondary | ICD-10-CM | POA: Diagnosis not present

## 2022-12-23 DIAGNOSIS — M25512 Pain in left shoulder: Secondary | ICD-10-CM | POA: Diagnosis not present

## 2022-12-23 DIAGNOSIS — I10 Essential (primary) hypertension: Secondary | ICD-10-CM | POA: Diagnosis not present

## 2022-12-23 DIAGNOSIS — E782 Mixed hyperlipidemia: Secondary | ICD-10-CM | POA: Diagnosis not present

## 2023-05-21 ENCOUNTER — Other Ambulatory Visit: Payer: Self-pay | Admitting: Family Medicine

## 2023-05-21 DIAGNOSIS — Z1231 Encounter for screening mammogram for malignant neoplasm of breast: Secondary | ICD-10-CM

## 2023-06-03 ENCOUNTER — Ambulatory Visit
Admission: RE | Admit: 2023-06-03 | Discharge: 2023-06-03 | Disposition: A | Discharge status: Home / Self Care | Payer: Medicaid Other | Source: Ambulatory Visit | Attending: Family Medicine | Admitting: Family Medicine

## 2023-06-03 DIAGNOSIS — Z1231 Encounter for screening mammogram for malignant neoplasm of breast: Secondary | ICD-10-CM

## 2023-12-06 ENCOUNTER — Emergency Department (HOSPITAL_BASED_OUTPATIENT_CLINIC_OR_DEPARTMENT_OTHER)
Admission: EM | Admit: 2023-12-06 | Discharge: 2023-12-07 | Disposition: A | Discharge status: Home / Self Care | Attending: Emergency Medicine | Admitting: Emergency Medicine

## 2023-12-06 ENCOUNTER — Other Ambulatory Visit: Payer: Self-pay

## 2023-12-06 ENCOUNTER — Encounter (HOSPITAL_BASED_OUTPATIENT_CLINIC_OR_DEPARTMENT_OTHER): Payer: Self-pay | Admitting: *Deleted

## 2023-12-06 DIAGNOSIS — Z79899 Other long term (current) drug therapy: Secondary | ICD-10-CM | POA: Diagnosis not present

## 2023-12-06 DIAGNOSIS — E876 Hypokalemia: Secondary | ICD-10-CM | POA: Diagnosis not present

## 2023-12-06 DIAGNOSIS — R55 Syncope and collapse: Secondary | ICD-10-CM | POA: Insufficient documentation

## 2023-12-06 DIAGNOSIS — E119 Type 2 diabetes mellitus without complications: Secondary | ICD-10-CM | POA: Insufficient documentation

## 2023-12-06 DIAGNOSIS — I10 Essential (primary) hypertension: Secondary | ICD-10-CM | POA: Diagnosis not present

## 2023-12-06 LAB — CBC
HCT: 39.9 % (ref 36.0–46.0)
Hemoglobin: 13.3 g/dL (ref 12.0–15.0)
MCH: 30.3 pg (ref 26.0–34.0)
MCHC: 33.3 g/dL (ref 30.0–36.0)
MCV: 90.9 fL (ref 80.0–100.0)
Platelets: 348 10*3/uL (ref 150–400)
RBC: 4.39 MIL/uL (ref 3.87–5.11)
RDW: 12.2 % (ref 11.5–15.5)
WBC: 8.8 10*3/uL (ref 4.0–10.5)
nRBC: 0 % (ref 0.0–0.2)

## 2023-12-06 LAB — URINALYSIS, MICROSCOPIC (REFLEX): WBC, UA: NONE SEEN WBC/hpf (ref 0–5)

## 2023-12-06 LAB — URINALYSIS, ROUTINE W REFLEX MICROSCOPIC
Bilirubin Urine: NEGATIVE
Glucose, UA: NEGATIVE mg/dL
Ketones, ur: 80 mg/dL — AB
Leukocytes,Ua: NEGATIVE
Nitrite: NEGATIVE
Protein, ur: NEGATIVE mg/dL
Specific Gravity, Urine: 1.025 (ref 1.005–1.030)
pH: 5.5 (ref 5.0–8.0)

## 2023-12-06 LAB — CBG MONITORING, ED: Glucose-Capillary: 208 mg/dL — ABNORMAL HIGH (ref 70–99)

## 2023-12-06 LAB — BASIC METABOLIC PANEL
Anion gap: 11 (ref 5–15)
BUN: 18 mg/dL (ref 6–20)
CO2: 24 mmol/L (ref 22–32)
Calcium: 8.5 mg/dL — ABNORMAL LOW (ref 8.9–10.3)
Chloride: 105 mmol/L (ref 98–111)
Creatinine, Ser: 0.92 mg/dL (ref 0.44–1.00)
GFR, Estimated: >60 mL/min (ref >60)
Glucose, Bld: 209 mg/dL — ABNORMAL HIGH (ref 70–99)
Potassium: 3.1 mmol/L — ABNORMAL LOW (ref 3.5–5.1)
Sodium: 140 mmol/L (ref 135–145)

## 2023-12-06 MED ORDER — POTASSIUM CHLORIDE CRYS ER 20 MEQ PO TBCR
40.0000 meq | EXTENDED_RELEASE_TABLET | Freq: Once | ORAL | Status: AC
  Administered 2023-12-07: 40 meq via ORAL
  Filled 2023-12-06: qty 2

## 2023-12-06 NOTE — ED Provider Notes (Signed)
 [place] [facility] Provider Note   CSN: [id number] Arrival date & time: 12/06/23  1934     History {Add pertinent medical, surgical, social history, OB history to HPI:1} Chief Complaint  Patient presents with   Loss of Consciousness    Sophia Flores is a 53 y.o. female.  The history is provided by the patient.  Loss of Consciousness Sophia Flores is a 53 y.o. female who presents to the Emergency Department complaining of *** Donated plasma at 530. Went to [employer] station, sat down and felt dizzy and passed out. Unclear duration. Felt woozy No ha, cp, abdominal pain. Had URI a few weeks ago. Currently seasonal allergies. No leg swelling/pain.  No hormones. No hx/o DVT/PE.   Hx/o DM, HPL, HTN      Home Medications Prior to Admission medications   Medication Sig Start Date End Date Taking? Authorizing Provider  amLODipine (NORVASC) 5 MG tablet Take 5 mg by mouth daily. 07/04/21   [provider]  atorvastatin (LIPITOR) 10 MG tablet Take 1 tablet by mouth daily. 02/17/20   [provider]  escitalopram (LEXAPRO) 10 MG tablet 1 tablet 02/29/20   [provider]  ibuprofen (ADVIL) 200 MG tablet Take 200 mg by mouth every 6 (six) hours as needed.    [provider]      Allergies    Codeine, Metformin hcl, and Morphine    Review of Systems   Review of Systems  Cardiovascular:  Positive for syncope.  All other systems reviewed and are negative.   Physical Exam Updated Vital Signs BP (!) 133/95   Pulse 79   Temp 98 F (36.7 C) (Oral)   Resp 16   SpO2 100%  Physical Exam Vitals and nursing note reviewed.  Constitutional:      Appearance: She is well-developed.  HENT:     Head: Normocephalic and atraumatic.  Cardiovascular:     Rate and Rhythm: Normal rate and regular rhythm.     Heart sounds: No murmur heard. Pulmonary:     Effort: Pulmonary effort is normal. No respiratory distress.      Breath sounds: Normal breath sounds.  Abdominal:     Palpations: Abdomen is soft.     Tenderness: There is no abdominal tenderness. There is no guarding or rebound.  Musculoskeletal:        General: No swelling or tenderness.  Skin:    General: Skin is warm and dry.  Neurological:     Mental Status: She is alert and oriented to person, place, and time.     Comments: No asymmetry of facial movements.  5/5 strength in all four extremities with sensation to light touch intact in all four extremities.    Psychiatric:        Behavior: Behavior normal.     ED Results / Procedures / Treatments   Labs (all labs ordered are listed, but only abnormal results are displayed) Labs Reviewed  BASIC METABOLIC PANEL - Abnormal; Notable for the following components:      Result Value   Potassium 3.1 (*)    Glucose, Bld 209 (*)    Calcium 8.5 (*)    All other components within normal limits  URINALYSIS, ROUTINE W REFLEX MICROSCOPIC - Abnormal; Notable for the following components:   Hgb urine dipstick TRACE (*)    Ketones, ur 80 (*)    All other components within normal limits  URINALYSIS, MICROSCOPIC (REFLEX) - Abnormal; Notable for  the following components:   Bacteria, UA RARE (*)    All other components within normal limits  CBG MONITORING, ED - Abnormal; Notable for the following components:   Glucose-Capillary 208 (*)    All other components within normal limits  CBC    EKG None ED ECG REPORT   Date: 12/06/2023  Rate: 70  Rhythm: normal sinus rhythm  QRS Axis: indeterminate  Intervals: normal  ST/T Wave abnormalities: nonspecific ST changes  Conduction Disutrbances:none  Narrative Interpretation:   Old EKG Reviewed: none available  I have personally reviewed the EKG tracing and agree with the computerized printout as noted.  Radiology No results found.  Procedures Procedures  {Document cardiac monitor, telemetry assessment procedure when appropriate:1}  Medications  Ordered in ED Medications - No data to display  ED Course/ Medical Decision Making/ A&P   {   Click here for ABCD2, HEART and other calculatorsREFRESH Note before signing :1}                              Medical Decision Making Amount and/or Complexity of Data Reviewed Labs: ordered.  Risk Prescription drug management.   ***  {Document critical care time when appropriate:1} {Document review of labs and clinical decision tools ie heart score, Chads2Vasc2 etc:1}  {Document your independent review of radiology images, and any outside records:1} {Document your discussion with family members, caretakers, and with consultants:1} {Document social determinants of health affecting pt's care:1} {Document your decision making why or why not admission, treatments were needed:1} Final Clinical Impression(s) / ED Diagnoses Final diagnoses:  None    Rx / DC Orders ED Discharge Orders     None

## 2023-12-06 NOTE — ED Triage Notes (Signed)
 Pt is brought in by ems due to syncope while at restaurant.  EMS reports that pt was conscious on arrival of ems and VSS and EKG wnl.  Pt reports that she donated plasma today and was feeling fine and then went to eat a sandwich at [employer] and fainted while sitting down.  No CP or sob with this.  Pt is feeling fine again and is alert and oriented.

## 2024-07-11 ENCOUNTER — Other Ambulatory Visit: Payer: Self-pay | Admitting: Family Medicine

## 2024-07-11 DIAGNOSIS — Z1231 Encounter for screening mammogram for malignant neoplasm of breast: Secondary | ICD-10-CM

## 2024-07-15 ENCOUNTER — Ambulatory Visit
Admission: RE | Admit: 2024-07-15 | Discharge: 2024-07-15 | Disposition: A | Discharge status: Home / Self Care | Source: Ambulatory Visit | Attending: Physician Assistant | Admitting: Physician Assistant

## 2024-07-15 ENCOUNTER — Other Ambulatory Visit: Payer: Self-pay | Admitting: Physician Assistant

## 2024-07-15 DIAGNOSIS — Z1231 Encounter for screening mammogram for malignant neoplasm of breast: Secondary | ICD-10-CM
